# Patient Record
Sex: Female | Born: 1980 | Hispanic: No | Marital: Married | Smoking: Never smoker
Health system: Southern US, Community
[De-identification: ages and names within clinical notes are randomized; demographics above are authoritative.]

## PROBLEM LIST (undated history)

## (undated) DIAGNOSIS — E785 Hyperlipidemia, unspecified: Secondary | ICD-10-CM

## (undated) HISTORY — DX: Hyperlipidemia, unspecified: E78.5

## (undated) HISTORY — PX: OTHER SURGICAL HISTORY: SHX169

## (undated) HISTORY — PX: APPENDECTOMY: SHX54

---

## 2014-06-09 ENCOUNTER — Encounter: Payer: Self-pay | Admitting: Obstetrics and Gynecology

## 2014-06-24 NOTE — L&D Delivery Note (Signed)
VAGINAL DELIVERY NOTE:  Date of Delivery: 11/19/2014 Primary ZO:XWRUOB:achd Gestational Age/EDD: 5872w5d 12/12/2014, Date entered prior to episode creation Antepartum complications: gestational diabetes and + PPD  tx with rifampin  Attending Physician: Ihor Austinhomas J Sydnee Lamour MD Delivery Type: spontaneous vaginal delivery , female 8/9 apgars Anesthesia: none Laceration: 1st degree Episiotomy: none Placenta: spontaneous Intrapartum complications: precipitous active phase second stage . RN performed delivery Estimated Blood Loss:100 cc GBS: negative Procedure Details:2 interrupted 000 vicryl first degree laceration   Baby: Liveborn female, Apgars 8/9,

## 2014-07-04 ENCOUNTER — Ambulatory Visit: Payer: Self-pay | Admitting: Family Medicine

## 2014-10-17 ENCOUNTER — Ambulatory Visit
Admit: 2014-10-17 | Disposition: A | Discharge status: Home / Self Care | Payer: Self-pay | Attending: Advanced Practice Midwife | Admitting: Advanced Practice Midwife

## 2014-10-24 ENCOUNTER — Ambulatory Visit: Payer: Self-pay | Admitting: Dietician

## 2014-11-01 ENCOUNTER — Encounter: Payer: Medicaid Other | Attending: Family Medicine | Admitting: Dietician

## 2014-11-01 ENCOUNTER — Encounter: Payer: Self-pay | Admitting: Dietician

## 2014-11-01 VITALS — BP 98/56 | Ht 63.0 in | Wt 178.1 lb

## 2014-11-01 DIAGNOSIS — O24419 Gestational diabetes mellitus in pregnancy, unspecified control: Secondary | ICD-10-CM | POA: Diagnosis not present

## 2014-11-01 DIAGNOSIS — O24410 Gestational diabetes mellitus in pregnancy, diet controlled: Secondary | ICD-10-CM

## 2014-11-01 NOTE — Progress Notes (Signed)
Appt. Start Time: 1100 Appt. End Time: 1200  GDM Class 2 Diabetes Overview - define DM; state own type of DM; identify functions of pancreas and insulin; define insulin deficiency vs insulin resistance  Psychosocial - identify DM as a source of stress; state the effects of stress on BG control; verbalize appropriate stress management techniques; identify personal stress issues   Nutritional Management - describe effects of food on blood glucose; identify sources of carbohydrate, protein and fat; verbalize the importance of balance meals in controlling blood glucose; identify meals as well balanced or not; estimate servings of carbohydrate from menus; use food labels to identify servings size, content of carbohydrate, fiber, protein, fat, saturated fat and sodium; recognize food sources of fat, saturated fat, trans fat, sodium and verbalize goals for intake; describe healthful appropriate food choices when dining out   Exercise - describe the effects of exercise on blood glucose and importance of regular exercise in controlling diabetes; state a plan for personal exercise; verbalize contraindications for exercise  Medications - state name, dose, timing of currently prescribed medications; describe types of medications available for diabetes  Self-Monitoring - state importance of HBGM and demo procedure accurately; use HBGM results to effectively manage diabetes; identify importance of regular HbA1C testing and goals for results  Acute Complications/Sick Day Guidelines - recognize hyperglycemia and hypoglycemia with causes and effects; identify blood glucose results as high, low or in control; list steps in treating and preventing high and low blood glucose; state appropriate measure to manage blood glucose when ill (need for meds, HBGM plan, when to call physician, need for fluids)  Chronic Complications/Foot, Skin, Eye Dental Care - identify possible long-term complications of diabetes (retinopathy,  neuropathy, nephropathy, cardiovascular disease, infections); explain steps in prevention and treatment of chronic complications; state importance of daily self-foot exams; describe how to examine feet and what to look for; explain appropriate eye and dental care  Lifestyle Changes/Goals & Health/Community Resources - state benefits of making appropriate lifestyle changes; identify habits that need to change (meals, tobacco, alcohol); identify strategies to reduce risk factors for personal health; set goals for proper diabetes care; state need for and frequency of healthcare follow-up; describe appropriate community resources for good health (ADA, web sites, apps)   Pregnancy/Sexual Health - define gestational diabetes; state importance of good blood glucose control and birth control prior to pregnancy; state importance of good blood glucose control in preventing sexual problems (impotence, vaginal dryness, infections, loss of desire); state relationship of blood glucose control and pregnancy outcome; describe risk of maternal and fetal complications  Teaching Materials Used: Meal Plan Food Lists Food Safety Preventing Diabetes

## 2014-11-19 ENCOUNTER — Inpatient Hospital Stay
Admission: EM | Admit: 2014-11-19 | Discharge: 2014-11-21 | DRG: 775 | Disposition: A | Discharge status: Home / Self Care | Payer: Medicaid Other | Attending: Obstetrics and Gynecology | Admitting: Obstetrics and Gynecology

## 2014-11-19 ENCOUNTER — Encounter: Payer: Self-pay | Admitting: *Deleted

## 2014-11-19 DIAGNOSIS — O42913 Preterm premature rupture of membranes, unspecified as to length of time between rupture and onset of labor, third trimester: Principal | ICD-10-CM | POA: Diagnosis present

## 2014-11-19 DIAGNOSIS — O24420 Gestational diabetes mellitus in childbirth, diet controlled: Secondary | ICD-10-CM | POA: Diagnosis present

## 2014-11-19 DIAGNOSIS — Z3A36 36 weeks gestation of pregnancy: Secondary | ICD-10-CM | POA: Diagnosis not present

## 2014-11-19 DIAGNOSIS — O4290 Premature rupture of membranes, unspecified as to length of time between rupture and onset of labor, unspecified weeks of gestation: Secondary | ICD-10-CM | POA: Diagnosis present

## 2014-11-19 DIAGNOSIS — O288 Other abnormal findings on antenatal screening of mother: Secondary | ICD-10-CM | POA: Diagnosis present

## 2014-11-19 DIAGNOSIS — O42013 Preterm premature rupture of membranes, onset of labor within 24 hours of rupture, third trimester: Secondary | ICD-10-CM

## 2014-11-19 LAB — OB RESULTS CONSOLE RPR
RPR: NONREACTIVE
RPR: NONREACTIVE

## 2014-11-19 LAB — CBC
HEMATOCRIT: 42.8 % (ref 35.0–47.0)
Hemoglobin: 14.1 g/dL (ref 12.0–16.0)
MCH: 28.6 pg (ref 26.0–34.0)
MCHC: 32.8 g/dL (ref 32.0–36.0)
MCV: 87.3 fL (ref 80.0–100.0)
Platelets: 185 10*3/uL (ref 150–440)
RBC: 4.91 MIL/uL (ref 3.80–5.20)
RDW: 15.5 % — ABNORMAL HIGH (ref 11.5–14.5)
WBC: 9.7 10*3/uL (ref 3.6–11.0)

## 2014-11-19 LAB — ABO/RH: ABO/RH(D): B POS

## 2014-11-19 LAB — OB RESULTS CONSOLE HEPATITIS B SURFACE ANTIGEN: HEP B S AG: NEGATIVE

## 2014-11-19 LAB — TYPE AND SCREEN
ABO/RH(D): B POS
ANTIBODY SCREEN: NEGATIVE

## 2014-11-19 LAB — OB RESULTS CONSOLE HIV ANTIBODY (ROUTINE TESTING)
HIV: NONREACTIVE
HIV: NONREACTIVE

## 2014-11-19 LAB — OB RESULTS CONSOLE ABO/RH: RH Type: POSITIVE

## 2014-11-19 LAB — OB RESULTS CONSOLE GC/CHLAMYDIA
Chlamydia: NEGATIVE
Gonorrhea: NEGATIVE

## 2014-11-19 LAB — OB RESULTS CONSOLE ANTIBODY SCREEN: Antibody Screen: NEGATIVE

## 2014-11-19 LAB — OB RESULTS CONSOLE GBS: STREP GROUP B AG: NEGATIVE

## 2014-11-19 LAB — GLUCOSE, CAPILLARY
GLUCOSE-CAPILLARY: 66 mg/dL (ref 65–99)
GLUCOSE-CAPILLARY: 104 mg/dL — AB (ref 65–99)

## 2014-11-19 LAB — OB RESULTS CONSOLE HGB/HCT, BLOOD: HEMOGLOBIN: 12.5 g/dL

## 2014-11-19 MED ORDER — ONDANSETRON HCL 4 MG PO TABS: 4.0000 mg | ORAL_TABLET | ORAL | Status: DC | PRN

## 2014-11-19 MED ORDER — OXYTOCIN 40 UNITS IN LACTATED RINGERS INFUSION - SIMPLE MED
1.0000 m[IU]/min | INTRAVENOUS | Status: DC
Start: 2014-11-19 — End: 2014-11-20
  Administered 2014-11-19: 1 m[IU]/min via INTRAVENOUS

## 2014-11-19 MED ORDER — LIDOCAINE HCL (PF) 1 % IJ SOLN
30.0000 mL | INTRAMUSCULAR | Status: DC | PRN
  Filled 2014-11-19: qty 30

## 2014-11-19 MED ORDER — ZOLPIDEM TARTRATE 5 MG PO TABS: 5.0000 mg | ORAL_TABLET | Freq: Every evening | ORAL | Status: DC | PRN

## 2014-11-19 MED ORDER — ONDANSETRON HCL 4 MG/2ML IJ SOLN: 4.0000 mg | Freq: Four times a day (QID) | INTRAMUSCULAR | Status: DC | PRN

## 2014-11-19 MED ORDER — OXYTOCIN 10 UNIT/ML IJ SOLN
INTRAMUSCULAR | Status: AC
  Filled 2014-11-19: qty 2

## 2014-11-19 MED ORDER — DIPHENHYDRAMINE HCL 25 MG PO CAPS: 25.0000 mg | ORAL_CAPSULE | Freq: Four times a day (QID) | ORAL | Status: DC | PRN

## 2014-11-19 MED ORDER — CITRIC ACID-SODIUM CITRATE 334-500 MG/5ML PO SOLN: 30.0000 mL | ORAL | Status: DC | PRN

## 2014-11-19 MED ORDER — SIMETHICONE 80 MG PO CHEW: 80.0000 mg | CHEWABLE_TABLET | ORAL | Status: DC | PRN

## 2014-11-19 MED ORDER — LANOLIN HYDROUS EX OINT: TOPICAL_OINTMENT | CUTANEOUS | Status: DC | PRN

## 2014-11-19 MED ORDER — HYDROCODONE-ACETAMINOPHEN 5-325 MG PO TABS
1.0000 | ORAL_TABLET | ORAL | Status: DC | PRN
  Administered 2014-11-20: 1 via ORAL
  Filled 2014-11-19: qty 1

## 2014-11-19 MED ORDER — LACTATED RINGERS IV SOLN
500.0000 mL | INTRAVENOUS | Status: DC | PRN
  Administered 2014-11-19: 500 mL via INTRAVENOUS

## 2014-11-19 MED ORDER — AMMONIA AROMATIC IN INHA
RESPIRATORY_TRACT | Status: AC
  Filled 2014-11-19: qty 10

## 2014-11-19 MED ORDER — OXYTOCIN 40 UNITS IN LACTATED RINGERS INFUSION - SIMPLE MED: 62.5000 mL/h | INTRAVENOUS | Status: DC

## 2014-11-19 MED ORDER — MISOPROSTOL 200 MCG PO TABS
ORAL_TABLET | ORAL | Status: AC
  Filled 2014-11-19: qty 4

## 2014-11-19 MED ORDER — BUTORPHANOL TARTRATE 1 MG/ML IJ SOLN: 1.0000 mg | INTRAMUSCULAR | Status: DC | PRN

## 2014-11-19 MED ORDER — LACTATED RINGERS IV SOLN
INTRAVENOUS | Status: DC
  Administered 2014-11-19 (×2): via INTRAVENOUS

## 2014-11-19 MED ORDER — OXYTOCIN 40 UNITS IN LACTATED RINGERS INFUSION - SIMPLE MED
INTRAVENOUS | Status: AC
  Filled 2014-11-19: qty 1000

## 2014-11-19 MED ORDER — BUTORPHANOL TARTRATE 1 MG/ML IJ SOLN: 2.0000 mg | INTRAMUSCULAR | Status: DC | PRN

## 2014-11-19 MED ORDER — MAGNESIUM HYDROXIDE 400 MG/5ML PO SUSP: 30.0000 mL | ORAL | Status: DC | PRN

## 2014-11-19 MED ORDER — ACETAMINOPHEN 325 MG PO TABS: 650.0000 mg | ORAL_TABLET | ORAL | Status: DC | PRN

## 2014-11-19 MED ORDER — BENZOCAINE-MENTHOL 20-0.5 % EX AERO: 1.0000 "application " | INHALATION_SPRAY | CUTANEOUS | Status: DC | PRN

## 2014-11-19 MED ORDER — DIBUCAINE 1 % RE OINT
1.0000 | TOPICAL_OINTMENT | RECTAL | Status: DC | PRN
Start: 2014-11-19 — End: 2014-11-21

## 2014-11-19 MED ORDER — OXYCODONE-ACETAMINOPHEN 5-325 MG PO TABS: 1.0000 | ORAL_TABLET | ORAL | Status: DC | PRN

## 2014-11-19 MED ORDER — ONDANSETRON HCL 4 MG/2ML IJ SOLN: 4.0000 mg | INTRAMUSCULAR | Status: DC | PRN

## 2014-11-19 MED ORDER — WITCH HAZEL-GLYCERIN EX PADS
1.0000 | MEDICATED_PAD | CUTANEOUS | Status: DC | PRN
Start: 2014-11-19 — End: 2014-11-21

## 2014-11-19 MED ORDER — OXYCODONE-ACETAMINOPHEN 5-325 MG PO TABS: 2.0000 | ORAL_TABLET | ORAL | Status: DC | PRN

## 2014-11-19 MED ORDER — FERROUS SULFATE 325 (65 FE) MG PO TABS
325.0000 mg | ORAL_TABLET | Freq: Two times a day (BID) | ORAL | Status: DC
  Administered 2014-11-20 – 2014-11-21 (×3): 325 mg via ORAL
  Filled 2014-11-19 (×3): qty 1

## 2014-11-19 MED ORDER — LIDOCAINE HCL (PF) 1 % IJ SOLN
INTRAMUSCULAR | Status: AC
  Filled 2014-11-19: qty 30

## 2014-11-19 MED ORDER — IBUPROFEN 600 MG PO TABS
600.0000 mg | ORAL_TABLET | Freq: Four times a day (QID) | ORAL | Status: DC
  Administered 2014-11-19 – 2014-11-21 (×7): 600 mg via ORAL
  Filled 2014-11-19 (×7): qty 1

## 2014-11-19 MED ORDER — OXYTOCIN BOLUS FROM INFUSION
500.0000 mL | INTRAVENOUS | Status: DC
  Administered 2014-11-19: 500 mL via INTRAVENOUS

## 2014-11-19 MED ORDER — ACETAMINOPHEN 325 MG PO TABS
650.0000 mg | ORAL_TABLET | ORAL | Status: DC | PRN
Start: 2014-11-19 — End: 2014-11-21

## 2014-11-19 MED ORDER — SENNOSIDES-DOCUSATE SODIUM 8.6-50 MG PO TABS
2.0000 | ORAL_TABLET | Freq: Every day | ORAL | Status: DC
  Filled 2014-11-19 (×2): qty 2

## 2014-11-19 MED ORDER — PRENATAL MULTIVITAMIN CH
1.0000 | ORAL_TABLET | Freq: Every day | ORAL | Status: DC
  Administered 2014-11-20 – 2014-11-21 (×2): 1 via ORAL
  Filled 2014-11-19 (×2): qty 1

## 2014-11-19 NOTE — Plan of Care (Signed)
Problem: Consults Goal: Birthing Suites Patient Information Press F2 to bring up selections list  Outcome: Completed/Met Date Met:  11/19/14  Pt < [redacted] weeks EGA

## 2014-11-19 NOTE — Progress Notes (Signed)
Patient request BS check. She was showing symptoms of a low blood sugar (shaking, cold). BS was 104. Patient stated her normal BS range was 120. Gave patient 1 cup of orange juice to raise BS.

## 2014-11-19 NOTE — H&P (Signed)
Rebecca Santiago is a 34 y.o. female presenting for srom at 0700 , 36+5 pain . 2 prior ptd . Pt was on 17P antepartum  Diet control GDM .+ PPD test  History OB History    Gravida Para Term Preterm AB TAB SAB Ectopic Multiple Living   3 2 0 2 0 0 0 0 0 2      History reviewed. No pertinent past medical history. Past Surgical History  Procedure Laterality Date  . Appendectomy    . Left wrist surgery Left    Family History: family history is not on file. Social History:  reports that she has never smoked. She has never used smokeless tobacco. She reports that she does not drink alcohol or use illicit drugs.    Prenatal Transfer Tool  Maternal Diabetes: Yes:  Diabetes Type:  Diet controlled Genetic Screening: Maternal Ultrasounds/Referrals Fetal Ultrasounds or other Referrals:  Maternal Substance Abuse:  No Significant Maternal Medications:   Significant Maternal Lab Results:  None Other Comments:  None  ROS  Dilation: 3 Effacement (%): 80 Station: -3 Exam by:: Rebecca Santiago Blood pressure 133/82, pulse 81, temperature 98.4 F (36.9 C), temperature source Oral, resp. rate 18, height 5\' 3"  (1.6 m), weight 78.926 kg (174 lb), last menstrual period 03/08/2014. Exam Physical Exam  Lungs cta , ccv rrr without murmur cx 3 cm 80/-3 FSE placed VTX NST : cat 1 ctx q3-5 min   Prenatal labs: ABO, Rh: --/--/B POS (05/28 1141) Antibody: NEG (05/28 1141) Rubella:   RPR: Nonreactive (05/28 1049)  HBsAg:    HIV: Non-reactive (05/28 1049)  GBS:   negative    Assessment/Plan: srom , preterm .  Reassuring fetal monitoring  Stadol prn pain  FS  Pitocin augmentation    Rebecca Santiago 11/19/2014, 12:45 PM

## 2014-11-20 LAB — RPR: RPR Ser Ql: NONREACTIVE

## 2014-11-20 LAB — CBC
HCT: 41.8 % (ref 35.0–47.0)
HEMOGLOBIN: 13.9 g/dL (ref 12.0–16.0)
MCH: 29.1 pg (ref 26.0–34.0)
MCHC: 33.2 g/dL (ref 32.0–36.0)
MCV: 87.7 fL (ref 80.0–100.0)
Platelets: 153 10*3/uL (ref 150–440)
RBC: 4.76 MIL/uL (ref 3.80–5.20)
RDW: 14.8 % — AB (ref 11.5–14.5)
WBC: 12.3 10*3/uL — AB (ref 3.6–11.0)

## 2014-11-20 NOTE — Progress Notes (Signed)
Post Partum Day 1 Subjective: no complaints, up ad lib, voiding and tolerating PO  Objective: Blood pressure 106/70, pulse 70, temperature 98.1 F (36.7 C), temperature source Oral, resp. rate 18, height 5\' 3"  (1.6 m), weight 78.926 kg (174 lb), last menstrual period 03/08/2014, SpO2 98 %, unknown if currently breastfeeding.  Physical Exam:  General: alert, cooperative and no distress Lochia: appropriate Uterine Fundus: firm DVT Evaluation: No evidence of DVT seen on physical exam. Negative Homan's sign. No cords or calf tenderness. No significant calf/ankle edema.   Recent Labs  11/19/14 1140 11/20/14 0446  HGB 14.1 13.9  HCT 42.8 41.8    Assessment/Plan: Plan for discharge tomorrow and Breastfeeding. Doing well.   LOS: 1 day   Alys Dulak 11/20/2014, 12:28 PM

## 2014-11-21 MED ORDER — IBUPROFEN 600 MG PO TABS: 600.0000 mg | ORAL_TABLET | Freq: Four times a day (QID) | ORAL | Status: DC

## 2014-11-21 MED ORDER — LANOLIN HYDROUS EX OINT: 1.0000 "application " | TOPICAL_OINTMENT | CUTANEOUS | Status: DC | PRN

## 2014-11-21 MED ORDER — FERROUS SULFATE 325 (65 FE) MG PO TABS: 325.0000 mg | ORAL_TABLET | Freq: Two times a day (BID) | ORAL | Status: DC

## 2014-11-21 NOTE — Discharge Summary (Signed)
Physician Obstetric Discharge Summary  Patient ID: Rebecca CroftBinalben B Carbo MRN: [id number] DOB/AGE: 34/12/1980 34 y.o.   Date of Admission: 11/19/2014  Date of Discharge:   Admitting Diagnosis: Preterm premature rupture of membranes at 255w5d  Secondary Diagnosis: Gestational diabetes diet controlled (A1) and postitive PPD on treatment with rifampin  Mode of Delivery: normal spontaneous vaginal delivery     Discharge Diagnosis: No other diagnosis   Intrapartum Procedures: laceration 1st   Post partum procedures: normal postpartum care  Complications: 1st degree perineal laceration   Brief Hospital Course  Rebecca CroftBinalben B Bonenfant is a [id number] who had a SVD on 11/19/14;  for further details of this delivery, please refer to the delivery note.  Patient had an uncomplicated postpartum course.  By time of discharge on PPD#2, her pain was controlled on oral pain medications; she had appropriate lochia and was ambulating, voiding without difficulty and tolerating regular diet.  She was deemed stable for discharge to home.    Labs: CBC Latest Ref Rng 11/20/2014 11/19/2014 11/19/2014  WBC 3.6 - 11.0 K/uL 12.3(H) 9.7 -  Hemoglobin 12.0 - 16.0 g/dL 14.713.9 82.914.1 56.212.5  Hematocrit 35.0 - 47.0 % 41.8 42.8 -  Platelets 150 - 440 K/uL 153 185 -   B POS  Physical exam:  Blood pressure 118/62, pulse 83, temperature 98.6 F (37 C), temperature source Oral, resp. rate 20, height 5\' 3"  (1.6 m), weight 78.926 kg (174 lb), last menstrual period 03/08/2014, SpO2 98 %, unknown if currently breastfeeding. General: alert and no distress Lochia: appropriate Abdomen: soft, NT Uterine Fundus: firm Extremities: No evidence of DVT seen on physical exam. No lower extremity edema.  Discharge Instructions: Per After Visit Summary. Activity: Advance as tolerated. Pelvic rest for 6 weeks.  Also refer to Discharge Instructions Diet: Regular Medications:   Medication List    TAKE these medications        ferrous sulfate 325  (65 FE) MG tablet  Take 1 tablet (325 mg total) by mouth 2 (two) times daily with a meal.     ibuprofen 600 MG tablet  Commonly known as:  ADVIL,MOTRIN  Take 1 tablet (600 mg total) by mouth every 6 (six) hours.     lanolin Oint  Apply 1 application topically as needed (for breast care).     prenatal multivitamin Tabs tablet  Take 1 tablet by mouth daily at 12 noon.     rifampin 300 MG capsule  Commonly known as:  RIFADIN  Take 300 mg by mouth daily.       Outpatient follow up:      Follow-up Information    Follow up with SCHERMERHORN,THOMAS, MD In 6 weeks.   Specialty:  Obstetrics and Gynecology   Why:  For postpartum visit   Contact information:   [address] [place] [address] [phone]      Postpartum contraception: abstinence, desires sterilization  Discharged Condition: good  Discharged to: home   Newborn Data: Disposition:home with mother  Apgars: APGAR (1 MIN): 8   APGAR (5 MINS): 9   APGAR (10 MINS):    Baby Feeding: Breast  Christeen DouglasBEASLEY, Nikiesha Milford, MD 11/21/2014 10:39 AM

## 2014-11-21 NOTE — Discharge Instructions (Signed)
Care After Vaginal Delivery °Congratulations on your new baby!! ° °Refer to this sheet in the next few weeks. These discharge instructions provide you with information on caring for yourself after delivery. Your caregiver may also give you specific instructions. Your treatment has been planned according to the most current medical practices available, but problems sometimes occur. Call your caregiver if you have any problems or questions after you go home. ° °HOME CARE INSTRUCTIONS °· Take over-the-counter or prescription medicines only as directed by your caregiver or pharmacist. °· Do not drink alcohol, especially if you are breastfeeding or taking medicine to relieve pain. °· Do not chew or smoke tobacco. °· Do not use illegal drugs. °· Continue to use good perineal care. Good perineal care includes: °¨ Wiping your perineum from front to back. °¨ Keeping your perineum clean. °· Do not use tampons or douche until your caregiver says it is okay. °· Shower, wash your hair, and take tub baths as directed by your caregiver. °· Wear a well-fitting bra that provides breast support. °· Eat healthy foods. °· Drink enough fluids to keep your urine clear or pale yellow. °· Eat high-fiber foods such as whole grain cereals and breads, brown rice, beans, and fresh fruits and vegetables every day. These foods may help prevent or relieve constipation. °· Follow your caregiver's recommendations regarding resumption of activities such as climbing stairs, driving, lifting, exercising, or traveling. Specifically, no driving for two weeks, so that you are comfortable reacting quickly in an emergency. °· Talk to your caregiver about resuming sexual activities. Resumption of sexual activities is dependent upon your risk of infection, your rate of healing, and your comfort and desire to resume sexual activity. Usually we recommend waiting about six weeks, or until your bleeding stops and you are interested in sex. °· Try to have someone  help you with your household activities and your newborn for at least a few days after you leave the hospital. Even longer is better. °· Rest as much as possible. Try to rest or take a nap when your newborn is sleeping. Sleep deprivation can be very hard after delivery. °· Increase your activities gradually. °· Keep all of your scheduled postpartum appointments. It is very important to keep your scheduled follow-up appointments. At these appointments, your caregiver will be checking to make sure that you are healing physically and emotionally. ° °SEEK MEDICAL CARE IF:  °· You are passing large clots from your vagina.  °· You have a foul smelling discharge from your vagina. °· You have trouble urinating. °· You are urinating frequently. °· You have pain when you urinate. °· You have a change in your bowel movements. °· You have increasing redness, pain, or swelling near your vaginal incision (episiotomy) or vaginal tear. °· You have pus draining from your episiotomy or vaginal tear. °· Your episiotomy or vaginal tear is separating. °· You have painful, hard, or reddened breasts. °· You have a severe headache. °· You have blurred vision or see spots. °· You feel sad or depressed. °· You have thoughts of hurting yourself or your newborn. °· You have questions about your care, the care of your newborn, or medicines. °· You are dizzy or light-headed. °· You have a rash. °· You have nausea or vomiting. °· You were breastfeeding and have not had a menstrual period within 12 weeks after you stopped breastfeeding. °· You are not breastfeeding and have not had a menstrual period by the 12th week after delivery. °· You   have a fever. ° °SEEK IMMEDIATE MEDICAL CARE IF:  °· You have persistent pain. °· You have chest pain. °· You have shortness of breath. °· You faint. °· You have leg pain. °· You have stomach pain. °· Your vaginal bleeding saturates two or more sanitary pads in 1 hour. ° °MAKE SURE YOU:  °· Understand these  instructions. °· Will get help right away if you are not doing well or get worse. °·  °Document Released: 06/07/2000 Document Revised: 10/25/2013 Document Reviewed: 02/05/2012 ° °ExitCare® Patient Information ©2015 [employer]. This information is not intended to replace advice given to you by your health care provider. Make sure you discuss any questions you have with your health care provider. ° °

## 2014-11-21 NOTE — Progress Notes (Signed)
Pt discharged home with infant. Discharge teaching completed.  

## 2016-01-31 ENCOUNTER — Ambulatory Visit (INDEPENDENT_AMBULATORY_CARE_PROVIDER_SITE_OTHER): Payer: Self-pay | Admitting: Adult Health

## 2016-01-31 ENCOUNTER — Encounter: Payer: Self-pay | Admitting: Adult Health

## 2016-01-31 VITALS — BP 110/64 | HR 86 | Ht 63.0 in | Wt 151.5 lb

## 2016-01-31 DIAGNOSIS — M545 Low back pain: Secondary | ICD-10-CM

## 2016-01-31 DIAGNOSIS — R1084 Generalized abdominal pain: Secondary | ICD-10-CM

## 2016-01-31 DIAGNOSIS — Z3201 Encounter for pregnancy test, result positive: Secondary | ICD-10-CM

## 2016-01-31 DIAGNOSIS — O3680X0 Pregnancy with inconclusive fetal viability, not applicable or unspecified: Secondary | ICD-10-CM

## 2016-01-31 DIAGNOSIS — R51 Headache: Secondary | ICD-10-CM

## 2016-01-31 DIAGNOSIS — Z3490 Encounter for supervision of normal pregnancy, unspecified, unspecified trimester: Secondary | ICD-10-CM

## 2016-01-31 DIAGNOSIS — O26851 Spotting complicating pregnancy, first trimester: Secondary | ICD-10-CM

## 2016-01-31 DIAGNOSIS — N938 Other specified abnormal uterine and vaginal bleeding: Secondary | ICD-10-CM

## 2016-01-31 LAB — POCT URINE PREGNANCY: Preg Test, Ur: POSITIVE — AB

## 2016-01-31 NOTE — Progress Notes (Signed)
Subjective:     Patient ID: Rebecca Santiago, female   DOB: 10/09/1980, 35 y.o.   MRN: [id number]  HPI Rebecca Santiago is a 35 year old female in for UPT, she had spotting last week and has some stomach and back pain and headache.She is not sure if she wants to continue the pregnancy.She has 3 children and all were preterm.She has not taken any meds yet.  Review of Systems + spotting last week +stomach and back pain  + headache  Patient denies any headaches, hearing loss, fatigue, blurred vision, shortness of breath, chest pain, abdominal pain, problems with bowel movements, urination, or intercourse. No joint pain or mood swings.    Objective:   Physical Exam BP 110/64 (BP Location: Left Arm, Patient Position: Sitting, Cuff Size: Normal)   Pulse 86   Ht 5\' 3"  (1.6 m)   Wt 151 lb 8 oz (68.7 kg)   LMP 12/15/2015 (Exact Date)   Breastfeeding? No   BMI 26.84 kg/m +UPT, about 6+ 5 weeks by LMP with EDD 09/20/16, Skin warm and dry. Neck: mid line trachea, normal thyroid, good ROM, no lymphadenopathy noted. Lungs: clear to ausculation bilaterally. Cardiovascular: regular rate and rhythm.Abdomen is soft and non tender.Her blood type is B+ in epic.Will get in for US Friday, to determine how far along and if viable.     Assessment:     UPT+ Pregnant Spotting last week    Plan:     Return in 2 days for dating US No sex Take extra strength tylenol

## 2016-01-31 NOTE — Patient Instructions (Signed)
No sex  Take extra strength tylenol  Return in 2 days for [place]

## 2016-02-01 ENCOUNTER — Ambulatory Visit (INDEPENDENT_AMBULATORY_CARE_PROVIDER_SITE_OTHER): Payer: Self-pay

## 2016-02-01 DIAGNOSIS — O3411 Maternal care for benign tumor of corpus uteri, first trimester: Secondary | ICD-10-CM

## 2016-02-01 DIAGNOSIS — O3680X0 Pregnancy with inconclusive fetal viability, not applicable or unspecified: Secondary | ICD-10-CM

## 2016-02-01 DIAGNOSIS — Z3A01 Less than 8 weeks gestation of pregnancy: Secondary | ICD-10-CM

## 2016-02-01 NOTE — Progress Notes (Signed)
US 6+6 wks single IUP w/ys, pos fht 144 bpm,normal ov's bilat,ant fundal fibroid 2.2 x 2 x 1.9 cm,crl 10.6 mm

## 2016-06-26 ENCOUNTER — Emergency Department (HOSPITAL_COMMUNITY)
Admission: EM | Admit: 2016-06-26 | Discharge: 2016-06-26 | Disposition: A | Discharge status: Home / Self Care | Payer: Medicaid Other | Attending: Emergency Medicine | Admitting: Emergency Medicine

## 2016-06-26 ENCOUNTER — Encounter (HOSPITAL_COMMUNITY): Payer: Self-pay | Admitting: Emergency Medicine

## 2016-06-26 DIAGNOSIS — Z91040 Latex allergy status: Secondary | ICD-10-CM | POA: Insufficient documentation

## 2016-06-26 DIAGNOSIS — N390 Urinary tract infection, site not specified: Secondary | ICD-10-CM | POA: Insufficient documentation

## 2016-06-26 DIAGNOSIS — R319 Hematuria, unspecified: Secondary | ICD-10-CM

## 2016-06-26 LAB — URINALYSIS, ROUTINE W REFLEX MICROSCOPIC
Bilirubin Urine: NEGATIVE
Glucose, UA: NEGATIVE mg/dL
Ketones, ur: NEGATIVE mg/dL
Nitrite: POSITIVE — AB
PH: 5 (ref 5.0–8.0)
Protein, ur: 100 mg/dL — AB
Specific Gravity, Urine: 1.015 (ref 1.005–1.030)

## 2016-06-26 LAB — PREGNANCY, URINE: Preg Test, Ur: NEGATIVE

## 2016-06-26 MED ORDER — PHENAZOPYRIDINE HCL 200 MG PO TABS: 200.0000 mg | ORAL_TABLET | Freq: Three times a day (TID) | ORAL | 0 refills | Status: AC

## 2016-06-26 MED ORDER — CEPHALEXIN 500 MG PO CAPS
500.0000 mg | ORAL_CAPSULE | Freq: Once | ORAL | Status: AC
  Administered 2016-06-26: 500 mg via ORAL
  Filled 2016-06-26: qty 1

## 2016-06-26 MED ORDER — PHENAZOPYRIDINE HCL 100 MG PO TABS
200.0000 mg | ORAL_TABLET | Freq: Once | ORAL | Status: AC
  Administered 2016-06-26: 200 mg via ORAL
  Filled 2016-06-26: qty 2

## 2016-06-26 MED ORDER — ACETAMINOPHEN 325 MG PO TABS
650.0000 mg | ORAL_TABLET | Freq: Once | ORAL | Status: AC
  Administered 2016-06-26: 650 mg via ORAL
  Filled 2016-06-26: qty 2

## 2016-06-26 MED ORDER — LIDOCAINE HCL (PF) 1 % IJ SOLN
INTRAMUSCULAR | Status: AC
  Filled 2016-06-26: qty 5

## 2016-06-26 MED ORDER — CEPHALEXIN 500 MG PO CAPS: 500.0000 mg | ORAL_CAPSULE | Freq: Four times a day (QID) | ORAL | 0 refills | Status: AC

## 2016-06-26 MED ORDER — CEFTRIAXONE SODIUM 1 G IJ SOLR
1.0000 g | Freq: Once | INTRAMUSCULAR | Status: AC
  Administered 2016-06-26: 1 g via INTRAMUSCULAR
  Filled 2016-06-26: qty 10

## 2016-06-26 NOTE — ED Provider Notes (Addendum)
[facility]-EMERGENCY DEPT Provider Note   CSN: [id number] Arrival date & time: 06/26/16  1332     History   Chief Complaint Chief Complaint  Patient presents with  . Urinary Frequency    pain with urination    HPI Rebecca Santiago is a 36 y.o. female.  Dysuria, frequency, urgency for 2-3 days.  No fever, sweats, chills. She is not sexually active. Severity is moderate. Nothing makes symptoms better or worse.      History reviewed. No pertinent past medical history.  Patient Active Problem List   Diagnosis Date Noted  . Premature rupture of membranes 11/19/2014    Past Surgical History:  Procedure Laterality Date  . APPENDECTOMY    . left wrist surgery Left     OB History    Gravida Para Term Preterm AB Living   4 3 0 3 0 3   SAB TAB Ectopic Multiple Live Births   0 0 0 0 1       Home Medications    Prior to Admission medications   Medication Sig Start Date End Date Taking? Authorizing Provider  cephALEXin (KEFLEX) 500 MG capsule Take 1 capsule (500 mg total) by mouth 4 (four) times daily. 06/26/16   Donnetta HutchingBrian Hiral Lukasiewicz, MD  phenazopyridine (PYRIDIUM) 200 MG tablet Take 1 tablet (200 mg total) by mouth 3 (three) times daily. 06/26/16   Donnetta HutchingBrian Anayi Bricco, MD    Family History Family History  Problem Relation Age of Onset  . Diabetes Paternal Grandfather   . Cancer Paternal Grandfather   . Diabetes Paternal Grandmother   . Diabetes Maternal Grandmother   . Diabetes Maternal Grandfather   . Heart attack Father   . Diabetes Father   . Diabetes Mother   . Diabetes Brother   . Diabetes Sister     Social History Social History  Substance Use Topics  . Smoking status: Never Smoker  . Smokeless tobacco: Never Used  . Alcohol use No     Allergies   Latex   Review of Systems Review of Systems  All other systems reviewed and are negative.    Physical Exam Updated Vital Signs BP 144/69 (BP Location: Left Arm)   Pulse 84   Temp 98.1 F (36.7 C) (Oral)   Resp 18    Ht 5\' 3"  (1.6 m)   Wt 149 lb 14.6 oz (68 kg)   LMP 06/07/2016   SpO2 100%   Breastfeeding? Unknown   BMI 26.56 kg/m   Physical Exam  Constitutional: She is oriented to person, place, and time. She appears well-developed and well-nourished.  Nontoxic-appearing  HENT:  Head: Normocephalic and atraumatic.  Eyes: Conjunctivae are normal.  Neck: Neck supple.  Cardiovascular: Normal rate and regular rhythm.   Pulmonary/Chest: Effort normal and breath sounds normal.  Abdominal: Soft. Bowel sounds are normal.  Musculoskeletal: Normal range of motion.  Neurological: She is alert and oriented to person, place, and time.  Skin: Skin is warm and dry.  Psychiatric: She has a normal mood and affect. Her behavior is normal.  Nursing note and vitals reviewed.    ED Treatments / Results  Labs (all labs ordered are listed, but only abnormal results are displayed) Labs Reviewed  URINALYSIS, ROUTINE W REFLEX MICROSCOPIC - Abnormal; Notable for the following:       Result Value   APPearance CLOUDY (*)    Hgb urine dipstick MODERATE (*)    Protein, ur 100 (*)    Nitrite POSITIVE (*)  Leukocytes, UA LARGE (*)    Bacteria, UA FEW (*)    All other components within normal limits  URINE CULTURE  PREGNANCY, URINE    EKG  EKG Interpretation None       Radiology No results found.  Procedures Procedures (including critical care time)  Medications Ordered in ED Medications  cephALEXin (KEFLEX) capsule 500 mg (not administered)  phenazopyridine (PYRIDIUM) tablet 200 mg (not administered)     Initial Impression / Assessment and Plan / ED Course  I have reviewed the triage vital signs and the nursing notes.  Pertinent labs & imaging results that were available during my care of the patient were reviewed by me and considered in my medical decision making (see chart for details).  Clinical Course     Patient has an obvious urinary tract infection. She does not appear to have  pyelonephritis. Rocephin 1 g IM given in ED. Rx Keflex 500 mg and Pyridium 200 mg. Urine culture pending.  Final Clinical Impressions(s) / ED Diagnoses   Final diagnoses:  Urinary tract infection with hematuria, site unspecified    New Prescriptions New Prescriptions   CEPHALEXIN (KEFLEX) 500 MG CAPSULE    Take 1 capsule (500 mg total) by mouth 4 (four) times daily.   PHENAZOPYRIDINE (PYRIDIUM) 200 MG TABLET    Take 1 tablet (200 mg total) by mouth 3 (three) times daily.     Donnetta Hutching, MD 06/26/16 1518    Donnetta Hutching, MD 06/26/16 [phone]

## 2016-06-26 NOTE — Discharge Instructions (Signed)
You have a urinary tract infection. Increase fluids. Prescription for antibiotic and medicine for urinary pain. Can also take Tylenol or ibuprofen.

## 2016-06-26 NOTE — ED Triage Notes (Signed)
Pt has had pain with urination for the past few days. No Fever. Increased urinary frequency and some vaginal discharge

## 2016-06-29 LAB — URINE CULTURE: Culture: >=100000 — AB

## 2016-06-30 ENCOUNTER — Telehealth: Payer: Self-pay

## 2016-06-30 NOTE — Telephone Encounter (Signed)
Post ED Visit - Positive Culture Follow-up  Culture report reviewed by antimicrobial stewardship pharmacist:  []  Enzo BiNathan Batchelder, Pharm.D. []  Celedonio MiyamotoJeremy Frens, Pharm.D., BCPS []  Garvin FilaMike Maccia, Pharm.D. []  Georgina PillionElizabeth Martin, Pharm.D., BCPS []  WoolrichMinh Pham, [address]Ph[address]D., BCPS, AAHIVP []  Estella HuskMichelle Turner, Pharm.D., BCPS, AAHIVP []  Tennis Mustassie Stewart, Pharm.D. []  Sherle Poeob Vincent, [address] Rachel Rumbarger Pharm D Positive urine culture Treated with Cephalexin, organism sensitive to the same and no further patient follow-up is required at this time.  Jerry CarasCullom, Nemiah Kissner Burnett 06/30/2016, 10:27 AM

## 2020-12-11 ENCOUNTER — Encounter (HOSPITAL_COMMUNITY): Payer: Self-pay | Admitting: Emergency Medicine

## 2020-12-11 ENCOUNTER — Other Ambulatory Visit: Payer: Self-pay

## 2020-12-11 DIAGNOSIS — M545 Low back pain, unspecified: Secondary | ICD-10-CM | POA: Insufficient documentation

## 2020-12-11 NOTE — ED Triage Notes (Addendum)
Pt with c/o lower back pain x 2 weeks. Denies injury. States she was seen 5 days ago by her PCP and given a script for Flexeril and told to take it for 5 days and if no better "to go to ED for xray".

## 2020-12-12 ENCOUNTER — Emergency Department (HOSPITAL_COMMUNITY)
Admission: EM | Admit: 2020-12-12 | Discharge: 2020-12-12 | Disposition: A | Discharge status: Home / Self Care | Payer: Self-pay | Attending: Emergency Medicine | Admitting: Emergency Medicine

## 2020-12-12 ENCOUNTER — Emergency Department (HOSPITAL_COMMUNITY): Payer: Self-pay

## 2020-12-12 DIAGNOSIS — M545 Low back pain, unspecified: Secondary | ICD-10-CM

## 2020-12-12 LAB — URINALYSIS, ROUTINE W REFLEX MICROSCOPIC
Bilirubin Urine: NEGATIVE
Glucose, UA: NEGATIVE mg/dL
Ketones, ur: NEGATIVE mg/dL
Nitrite: NEGATIVE
Protein, ur: NEGATIVE mg/dL
Specific Gravity, Urine: 1.006 (ref 1.005–1.030)
pH: 7 (ref 5.0–8.0)

## 2020-12-12 LAB — PREGNANCY, URINE: Preg Test, Ur: NEGATIVE

## 2020-12-12 MED ORDER — DIAZEPAM 2 MG PO TABS
2.0000 mg | ORAL_TABLET | Freq: Once | ORAL | Status: AC
  Administered 2020-12-12: 2 mg via ORAL
  Filled 2020-12-12: qty 1

## 2020-12-12 MED ORDER — LIDOCAINE 5 % EX PTCH: 1.0000 | MEDICATED_PATCH | CUTANEOUS | 0 refills | Status: AC

## 2020-12-12 MED ORDER — LIDOCAINE 5 % EX PTCH: 1.0000 | MEDICATED_PATCH | CUTANEOUS | 0 refills | Status: DC

## 2020-12-12 MED ORDER — METHOCARBAMOL 500 MG PO TABS: 500.0000 mg | ORAL_TABLET | Freq: Three times a day (TID) | ORAL | 0 refills | Status: DC | PRN

## 2020-12-12 MED ORDER — METHOCARBAMOL 500 MG PO TABS: 500.0000 mg | ORAL_TABLET | Freq: Three times a day (TID) | ORAL | 0 refills | Status: AC | PRN

## 2020-12-12 NOTE — Discharge Instructions (Addendum)
You were evaluated in the Emergency Department and after careful evaluation, we did not find any emergent condition requiring admission or further testing in the hospital.  Your exam/testing today was overall reassuring.  X-ray did not show any broken bones.  Suspect your pain is related to muscle strain or spasm.  Use Tylenol or Motrin at home for discomfort.  Could also use the numbing patches or the Robaxin muscle relaxer for more significant pain.  Please return to the Emergency Department if you experience any worsening of your condition.  Thank you for allowing [place] to be a part of your care.

## 2020-12-12 NOTE — ED Provider Notes (Signed)
AP-EMERGENCY DEPT [facility] Emergency Department Provider Note MRN:  [id number]  Arrival date & time: 12/12/20     Chief Complaint   Back Pain   History of Present Illness   Rebecca Santiago is a 40 y.o. year-old female with no pertinent past medical history presenting to the ED with chief complaint of back pain.  Location: Left lumbar/flank pain Duration: 5 days Onset: Gradual Timing: Const Description: Dull Severity: Moderate Exacerbating/Alleviating Factors: Worse with certain motions or positions Associated Symptoms: None Pertinent Negatives: Denies dysuria, no fever, no numbness or weakness to the arms or legs, no bowel or bladder dysfunction   Review of Systems  A complete 10 system review of systems was obtained and all systems are negative except as noted in the HPI and PMH.   Patient's Health History   History reviewed. No pertinent past medical history.  Past Surgical History:  Procedure Laterality Date   APPENDECTOMY     left wrist surgery Left     Family History  Problem Relation Age of Onset   Diabetes Paternal Grandfather    Cancer Paternal Grandfather    Diabetes Paternal Grandmother    Diabetes Maternal Grandmother    Diabetes Maternal Grandfather    Heart attack Father    Diabetes Father    Diabetes Mother    Diabetes Brother    Diabetes Sister     Social History   Socioeconomic History   Marital status: Legally Separated    Spouse name: Not on file   Number of children: Not on file   Years of education: Not on file   Highest education level: Not on file  Occupational History   Not on file  Tobacco Use   Smoking status: Never   Smokeless tobacco: Never  Substance and Sexual Activity   Alcohol use: No    Alcohol/week: 0.0 standard drinks   Drug use: No   Sexual activity: Yes    Birth control/protection: None  Other Topics Concern   Not on file  Social History Narrative   Not on file   Social Determinants of Health    Financial Resource Strain: Not on file  Food Insecurity: Not on file  Transportation Needs: Not on file  Physical Activity: Not on file  Stress: Not on file  Social Connections: Not on file  Intimate Partner Violence: Not on file     Physical Exam   Vitals:   12/11/20 2236 12/12/20 0247  BP: 135/90 (!) 135/91  Pulse: 82 64  Resp: 14 12  Temp: 98.7 F (37.1 C)   SpO2: 100% 100%    CONSTITUTIONAL: Well-appearing, NAD NEURO:  Alert and oriented x 3, no focal deficits EYES:  eyes equal and reactive ENT/NECK:  no LAD, no JVD CARDIO: Regular rate, well-perfused, normal S1 and S2 PULM:  CTAB no wheezing or rhonchi GI/GU:  normal bowel sounds, non-distended, non-tender MSK/SPINE:  No gross deformities, no edema, mild tenderness to the musculature of the left lumbar back SKIN:  no rash, atraumatic PSYCH:  Appropriate speech and behavior  *Additional and/or pertinent findings included in MDM below  Diagnostic and Interventional Summary    EKG Interpretation  Date/Time:    Ventricular Rate:    PR Interval:    QRS Duration:   QT Interval:    QTC Calculation:   R Axis:     Text Interpretation:          Labs Reviewed  URINALYSIS, ROUTINE W REFLEX MICROSCOPIC - Abnormal; Notable for the  following components:      Result Value   Color, Urine STRAW (*)    Hgb urine dipstick SMALL (*)    Leukocytes,Ua TRACE (*)    Bacteria, UA RARE (*)    All other components within normal limits  PREGNANCY, URINE    DG Lumbar Spine Complete  Final Result      Medications  diazepam (VALIUM) tablet 2 mg (2 mg Oral Given 12/12/20 0053)     Procedures  /  Critical Care Procedures  ED Course and Medical Decision Making  I have reviewed the triage vital signs, the nursing notes, and pertinent available records from the EMR.  Listed above are laboratory and imaging tests that I personally ordered, reviewed, and interpreted and then considered in my medical decision making (see  below for details).  Nontraumatic back pain, sent here for x-ray by PCP.  Pain somewhat located in the flank and so renal etiology considered.  However urinalysis is reassuring and pain seems to be reproducible by certain positions or movements.  Suspect MSK problem.  No signs or symptoms of myelopathy.  Screening x-ray is reassuring, appropriate for discharge.       Elmer Sow. Pilar Plate, MD [facility] Emergency Medicine [facility] [email or web address]  Final Clinical Impressions(s) / ED Diagnoses     ICD-10-CM   1. Acute low back pain, unspecified back pain laterality, unspecified whether sciatica present  M54.50       ED Discharge Orders          Ordered    lidocaine (LIDODERM) 5 %  Every 24 hours,   Status:  Discontinued        12/12/20 0400    methocarbamol (ROBAXIN) 500 MG tablet  Every 8 hours PRN,   Status:  Discontinued        12/12/20 0400    lidocaine (LIDODERM) 5 %  Every 24 hours        12/12/20 0402    methocarbamol (ROBAXIN) 500 MG tablet  Every 8 hours PRN        12/12/20 0402             Discharge Instructions Discussed with and Provided to Patient:    Discharge Instructions      You were evaluated in the Emergency Department and after careful evaluation, we did not find any emergent condition requiring admission or further testing in the hospital.  Your exam/testing today was overall reassuring.  X-ray did not show any broken bones.  Suspect your pain is related to muscle strain or spasm.  Use Tylenol or Motrin at home for discomfort.  Could also use the numbing patches or the Robaxin muscle relaxer for more significant pain.  Please return to the Emergency Department if you experience any worsening of your condition.  Thank you for allowing [place] to be a part of your care.        Sabas Sous, MD 12/12/20 [phone]

## 2021-06-29 ENCOUNTER — Ambulatory Visit (LOCAL_COMMUNITY_HEALTH_CENTER): Payer: Medicaid Other | Admitting: Family Medicine

## 2021-06-29 ENCOUNTER — Encounter: Payer: Self-pay | Admitting: Family Medicine

## 2021-06-29 ENCOUNTER — Other Ambulatory Visit: Payer: Self-pay

## 2021-06-29 VITALS — BP 117/79 | Ht 63.0 in | Wt 163.6 lb

## 2021-06-29 DIAGNOSIS — Z3009 Encounter for other general counseling and advice on contraception: Secondary | ICD-10-CM

## 2021-06-29 DIAGNOSIS — Z1272 Encounter for screening for malignant neoplasm of vagina: Secondary | ICD-10-CM

## 2021-06-29 DIAGNOSIS — Z01419 Encounter for gynecological examination (general) (routine) without abnormal findings: Secondary | ICD-10-CM | POA: Diagnosis not present

## 2021-06-29 NOTE — Progress Notes (Signed)
[facility] [address] Main Number: [phone]    Family Planning Visit- Initial Visit  Subjective:  Antoine PrimasBinalben Tabor is a 41 y.o.  G3P0   being seen today for an initial annual visit and to discuss reproductive life planning.  The patient is currently using IUD or IUS for pregnancy prevention. Patient reports   does not want a pregnancy in the next year.  Patient has the following medical conditions does not have a problem list on file.  Chief Complaint  Patient presents with   Annual Exam   Contraception    Removed    Patient reports her for physical and IUD removal   Patient denies any concerns for STI testing    Body mass index is 28.98 kg/m. - Patient is eligible for diabetes screening based on BMI and age 105>40?  no HA1C ordered? no  Patient reports 1  partner/s in last year. Desires STI screening?  No - declined   Has patient been screened once for HCV in the past?  No  No results found for: HCVAB  Does the patient have current drug use (including MJ), have a partner with drug use, and/or has been incarcerated since last result? No  If yes-- [facility]   Does the patient meet criteria for HBV testing? No  Criteria:  -Household, sexual or needle sharing contact with HBV -History of drug use -HIV positive -Those with known Hep C   Health Maintenance Due  Topic Date Due   COVID-19 Vaccine (1) Never done   HIV Screening  Never done   Hepatitis C Screening  Never done   TETANUS/TDAP  Never done   PAP SMEAR-Modifier  Never done   INFLUENZA VACCINE  Never done    Review of Systems  Constitutional:  Negative for chills, fever, malaise/fatigue and weight loss.  HENT:  Negative for congestion, hearing loss and sore throat.   Eyes:  Negative for blurred vision, double vision and photophobia.  Respiratory:  Negative for shortness of breath.   Cardiovascular:  Negative for chest  pain.  Gastrointestinal:  Negative for abdominal pain, blood in stool, constipation, diarrhea, heartburn, nausea and vomiting.  Genitourinary:  Negative for dysuria and frequency.  Musculoskeletal:  Negative for back pain, joint pain and neck pain.  Skin:  Negative for itching and rash.  Neurological:  Positive for headaches. Negative for dizziness and weakness.       Hx of migraines lat one 2 weeks ago    Endo/Heme/Allergies:  Does not bruise/bleed easily.  Psychiatric/Behavioral:  Negative for depression, substance abuse and suicidal ideas.    The following portions of the patient's history were reviewed and updated as appropriate: allergies, current medications, past family history, past medical history, past social history, past surgical history and problem list. Problem list updated.   See flowsheet for other program required questions.  Objective:   Vitals:   06/29/21 1432  BP: 117/79  Weight: 163 lb 9.6 oz (74.2 kg)  Height: 5\' 3"  (1.6 m)    Physical Exam Vitals and nursing note reviewed.  Constitutional:      Appearance: Normal appearance.  HENT:     Head: Normocephalic and atraumatic.     Mouth/Throat:     Mouth: Mucous membranes are moist.     Dentition: Normal dentition. No dental caries.     Pharynx: No oropharyngeal exudate or posterior oropharyngeal erythema.  Eyes:     General:  No scleral icterus. Neck:     Thyroid: No thyroid mass, thyromegaly or thyroid tenderness.  Cardiovascular:     Rate and Rhythm: Normal rate and regular rhythm.     Pulses: Normal pulses.     Heart sounds: Normal heart sounds.  Pulmonary:     Effort: Pulmonary effort is normal.     Breath sounds: Normal breath sounds.  Chest:  Breasts:    Right: Normal. No swelling, inverted nipple, mass, nipple discharge, skin change or tenderness.     Left: Normal. No swelling, inverted nipple, mass, nipple discharge, skin change or tenderness.  Abdominal:     General: Abdomen is flat. Bowel  sounds are normal.     Palpations: Abdomen is soft. There is mass.     Tenderness: There is abdominal tenderness in the right upper quadrant.       Comments: Pt has 4-5 mm mass at RUQ pt reports tenderness for 3 months.    Genitourinary:    General: Normal vulva.     Rectum: Normal.     Comments: External genitalia without, lice, nits, erythema, edema , lesions or inguinal adenopathy. Vagina with normal mucosa and discharge and pH equals 4.  Cervix without visual lesions, uterus firm, mobile, non-tender, no masses, CMT adnexal fullness or tenderness. IUD strings visible.    Musculoskeletal:        General: Normal range of motion.     Cervical back: Normal range of motion and neck supple.  Skin:    General: Skin is warm and dry.  Neurological:     General: No focal deficit present.     Mental Status: She is alert and oriented to person, place, and time.  Psychiatric:        Mood and Affect: Mood normal.        Behavior: Behavior normal.      Assessment and Plan:  Lee-Anne Flicker is a 41 y.o. female presenting to the [facility] Department for an initial annual wellness/contraceptive visit  Contraception counseling: Reviewed all forms of birth control options in the tiered based approach. available including abstinence; over the counter/barrier methods; hormonal contraceptive medication including pill, patch, ring, injection,contraceptive implant, ECP; hormonal and nonhormonal IUDs; permanent sterilization options including vasectomy and the various tubal sterilization modalities. Risks, benefits, and typical effectiveness rates were reviewed.  Questions were answered.  Written information was also given to the patient to review.  Patient desires IUD or IUS, this was prescribed for patient.    The patient will follow up in for IUD removal reinsertion.  The patient was told to call with any further questions, or with any concerns about this method of contraception.  Emphasized  use of condoms 100% of the time for STI prevention.  Patient was not offered ECP based on pt preference .    1. Smear, vaginal, as part of routine gynecological examination Well woman exam today  Pt has 4-5 mm mass at RUQ pt reports tenderness for 3 months.   Pap today  CBE today   Pt has IUD that was placed 5 years ago in [place].  Pt does not remember what type of IUD was placed.  Pt does want a new IUD insertion.    - IGP, Aptima HPV  2. Family planning counseling Contraception counseling: Reviewed all forms of birth control options in the tiered based approach. available including abstinence; over the counter/barrier methods; hormonal contraceptive medication including pill, patch, ring, injection,contraceptive implant, ECP; hormonal and nonhormonal IUDs; permanent  sterilization options including vasectomy and the various tubal sterilization modalities. Risks, benefits, and typical effectiveness rates were reviewed.  Questions were answered.  Written information was also given to the patient to review.  Patient desires IUD or IUS, this provider is unable to place IUD's.  Appointment scheduled for IUD removal/ insertion.    The patient will follow up in for IUD removal reinsertion.  The patient was told to call with any further questions, or with any concerns about this method of contraception.  Emphasized use of condoms 100% of the time for STI prevention.  Patient was not offered ECP based on pt preference .    No follow-ups on file.  No future appointments.  Wendi Snipes, FNP

## 2021-06-30 ENCOUNTER — Encounter (HOSPITAL_COMMUNITY): Payer: Self-pay | Admitting: Emergency Medicine

## 2021-07-03 LAB — IGP, APTIMA HPV
HPV Aptima: NEGATIVE
PAP Smear Comment: 0

## 2022-08-28 IMAGING — DX DG LUMBAR SPINE COMPLETE 4+V
5 series · 6 of 6 positions shown · non-contrast
Comparison: None.

CLINICAL DATA: Initial evaluation for acute back pain radiating to
left posterior hip.

EXAM:
LUMBAR SPINE - COMPLETE 4+ VIEW

[l-spine ap]
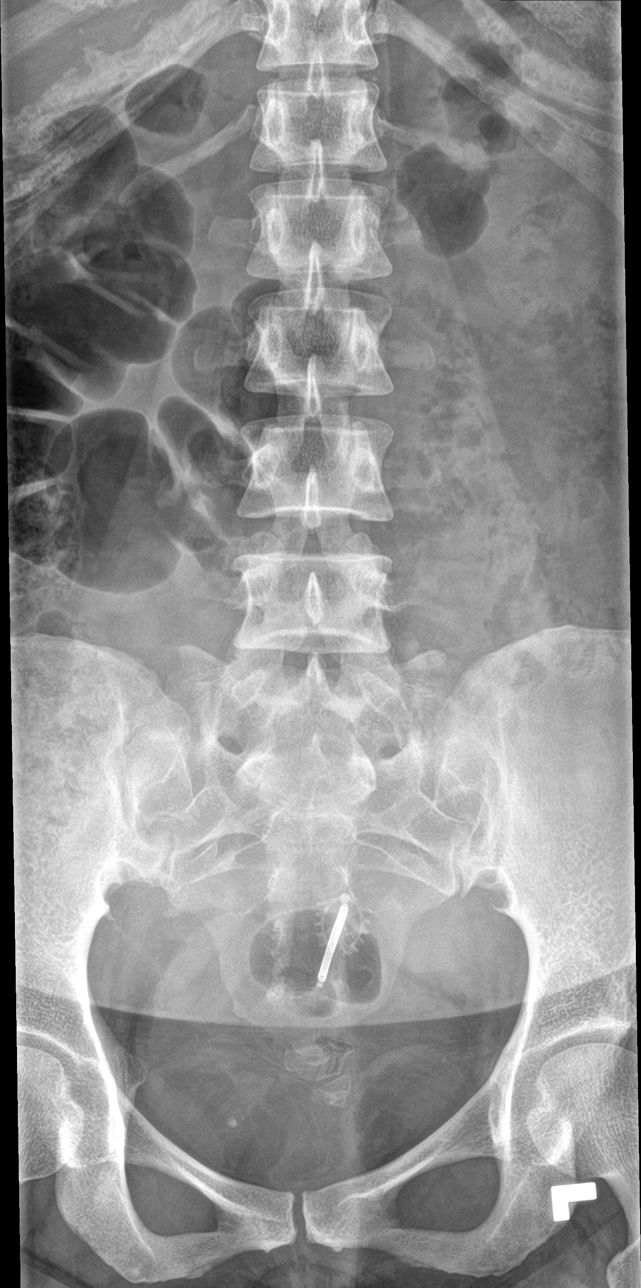

[l-spine obl (1 of 2)]
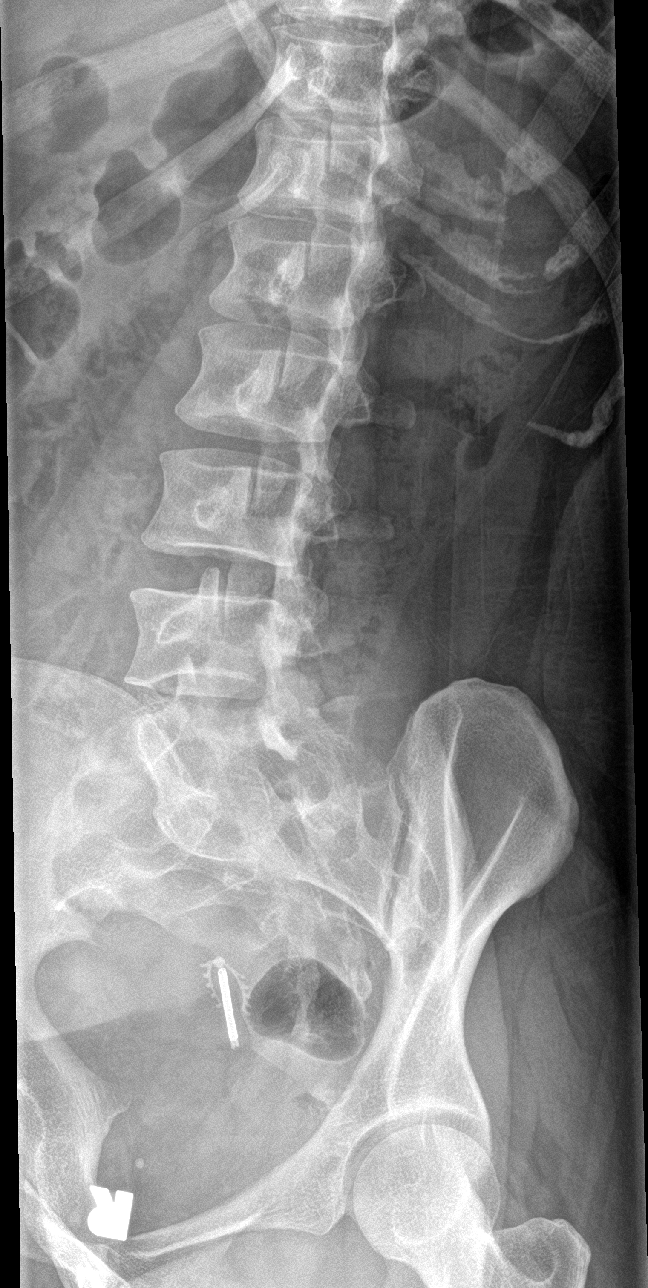

[Series 3: l-spine obl · 0.13mm/px · 2 of 2 slices shown (2 of 2)]
[im 1/2]
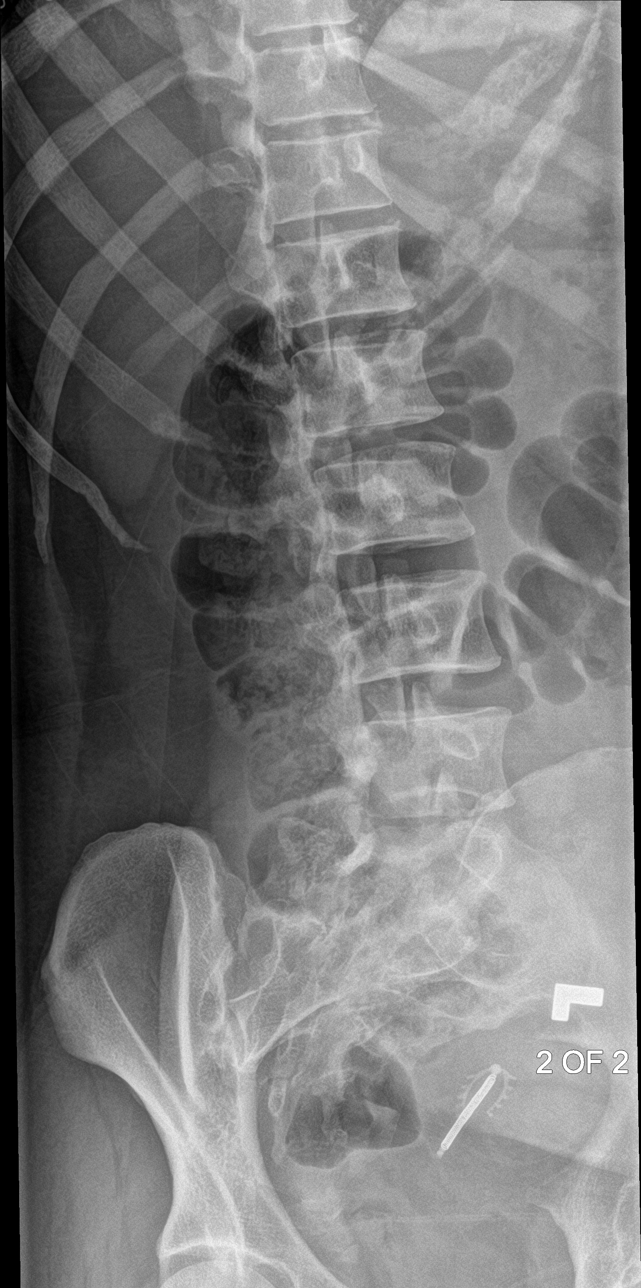
[im 2/2]
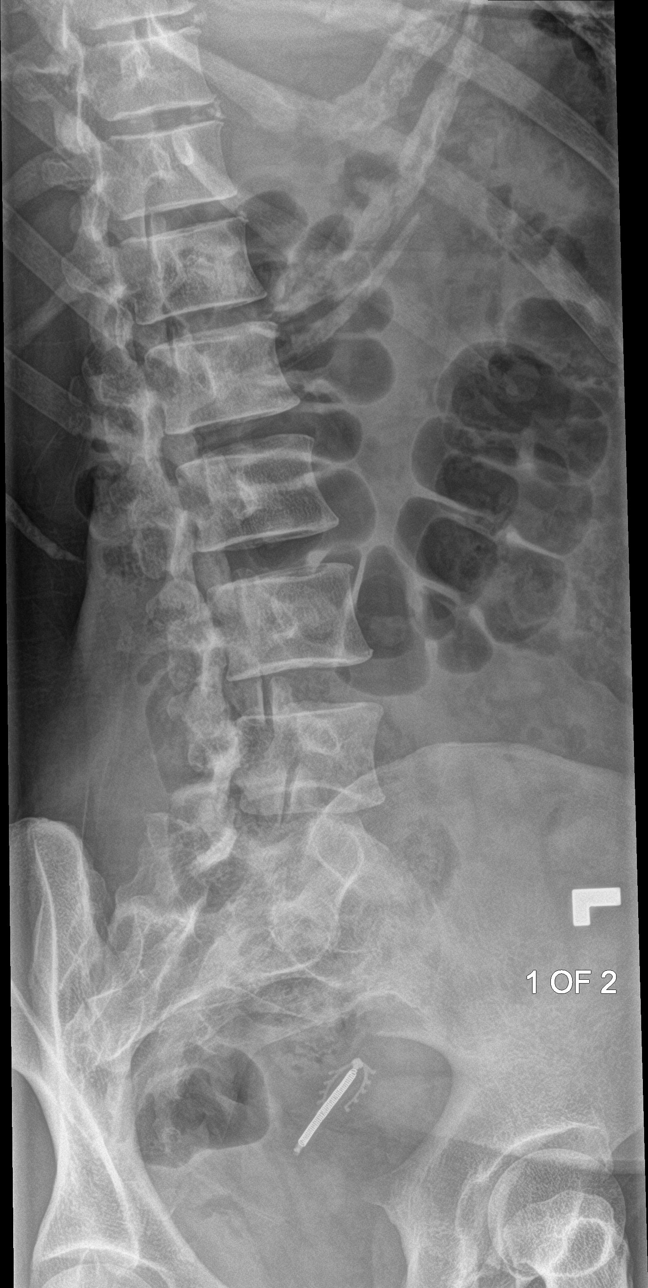

[l-spine lat]
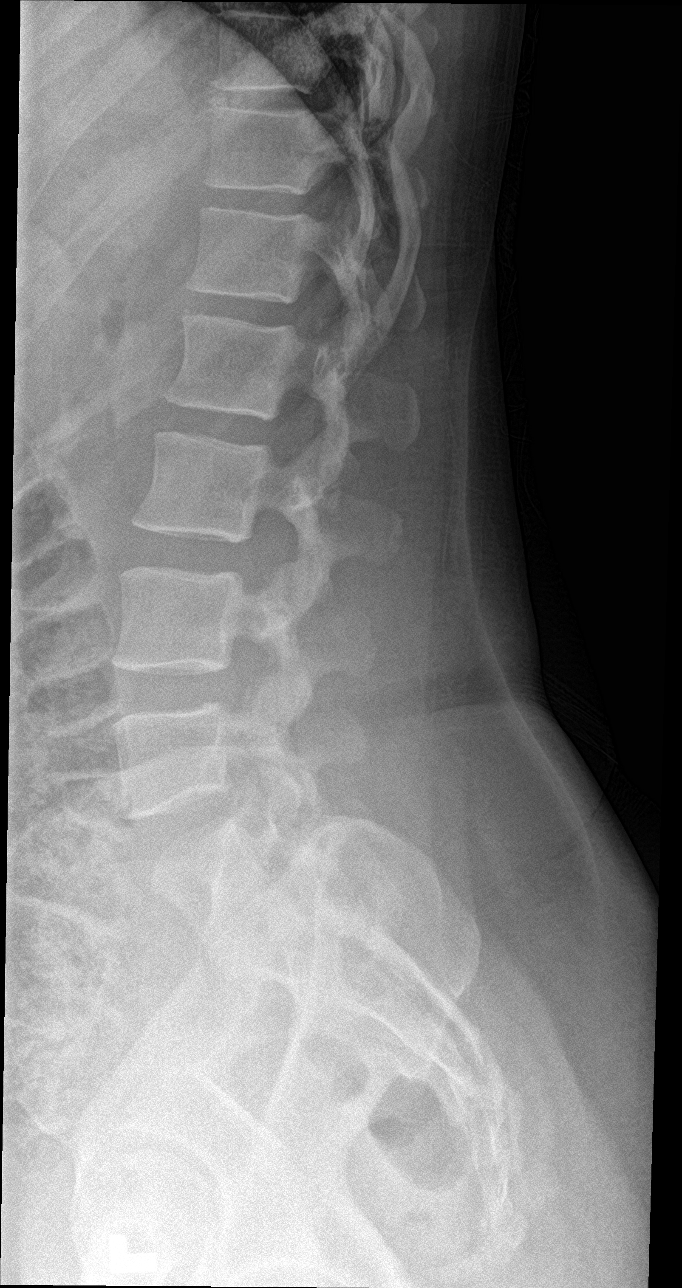

[l-spine spot]
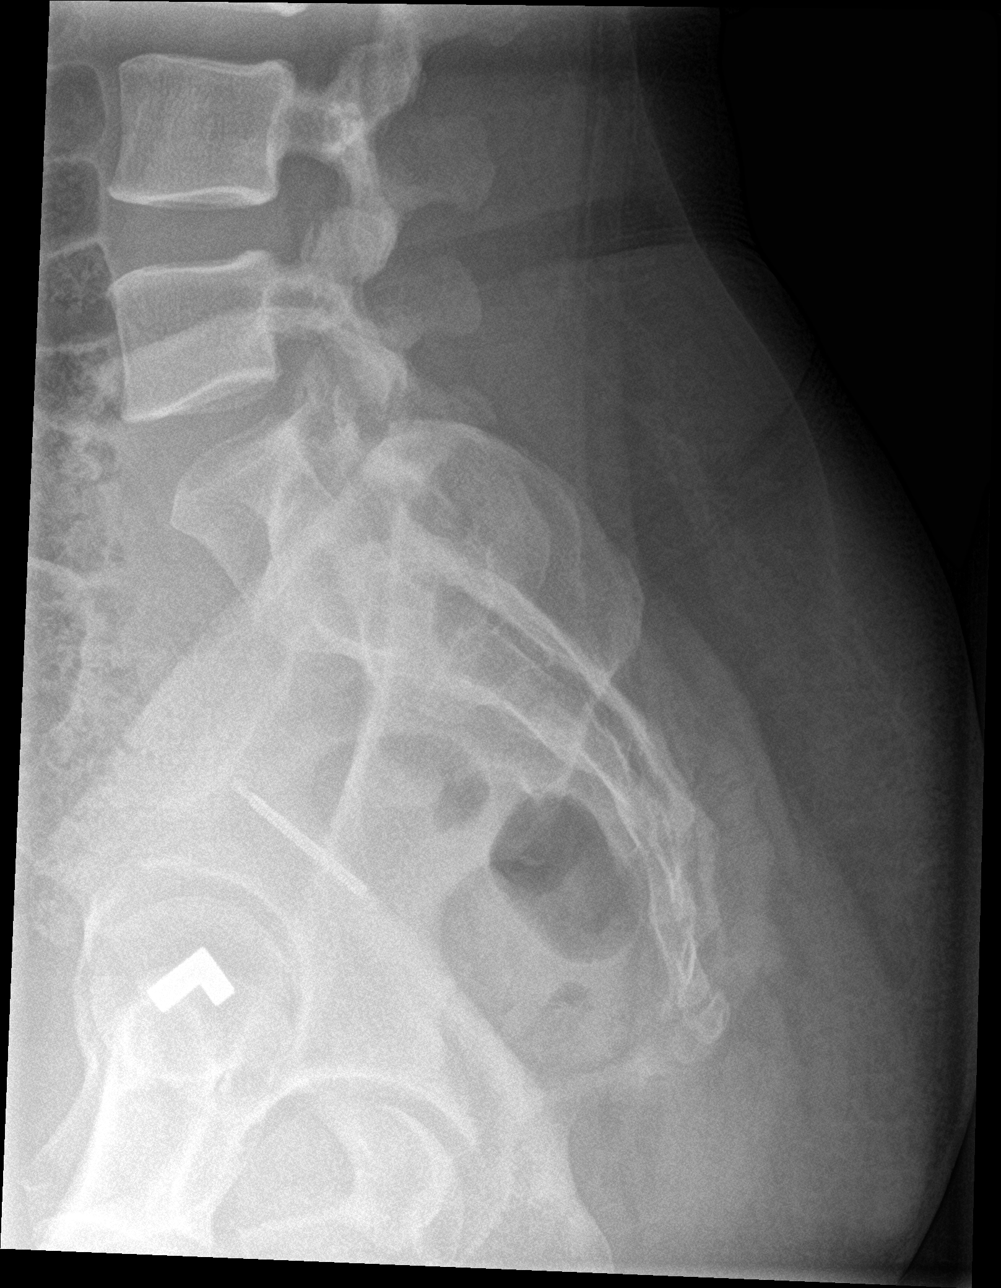

[6 of 6 positions shown; findings below may reference images not displayed]

FINDINGS: Transitional features present at the lumbosacral junction. Alignment
normal with preservation of the normal lumbar lordosis. No
listhesis. Vertebral body height well maintained without acute or
chronic fracture. Visualized sacrum and pelvis intact. SI joints
symmetric and normal.

Intervertebral disc space height well maintained. No significant
facet disease for age.

Visualized soft tissues demonstrate no acute finding. IUD overlies
the pelvic inlet.
IMPRESSION: Negative radiographs of the lumbar spine. No significant
degenerative changes for age.

## 2022-09-24 ENCOUNTER — Other Ambulatory Visit: Payer: Self-pay | Admitting: Internal Medicine

## 2022-09-24 DIAGNOSIS — Z1231 Encounter for screening mammogram for malignant neoplasm of breast: Secondary | ICD-10-CM

## 2022-10-01 ENCOUNTER — Inpatient Hospital Stay: Admission: RE | Admit: 2022-10-01 | Payer: Medicaid Other | Source: Ambulatory Visit

## 2022-12-03 ENCOUNTER — Ambulatory Visit
Admission: RE | Admit: 2022-12-03 | Discharge: 2022-12-03 | Disposition: A | Discharge status: Home / Self Care | Payer: Self-pay | Source: Ambulatory Visit | Attending: Internal Medicine | Admitting: Internal Medicine

## 2022-12-03 DIAGNOSIS — Z1231 Encounter for screening mammogram for malignant neoplasm of breast: Secondary | ICD-10-CM

## 2023-08-25 ENCOUNTER — Other Ambulatory Visit: Payer: Self-pay | Admitting: Internal Medicine

## 2023-08-25 DIAGNOSIS — N6310 Unspecified lump in the right breast, unspecified quadrant: Secondary | ICD-10-CM

## 2023-09-04 ENCOUNTER — Other Ambulatory Visit: Payer: Self-pay

## 2023-09-19 ENCOUNTER — Ambulatory Visit
Admission: RE | Admit: 2023-09-19 | Discharge: 2023-09-19 | Disposition: A | Discharge status: Home / Self Care | Payer: Self-pay | Source: Ambulatory Visit | Attending: Internal Medicine | Admitting: Internal Medicine

## 2023-09-19 DIAGNOSIS — N6310 Unspecified lump in the right breast, unspecified quadrant: Secondary | ICD-10-CM
